# Patient Record
Sex: Male | Born: 1995 | Race: Black or African American | Hispanic: No | Marital: Single | Smoking: Former smoker
Health system: Southern US, Community
[De-identification: ages and names within clinical notes are randomized; demographics above are authoritative.]

## PROBLEM LIST (undated history)

## (undated) DIAGNOSIS — J45909 Unspecified asthma, uncomplicated: Secondary | ICD-10-CM

---

## 2016-11-02 ENCOUNTER — Emergency Department (HOSPITAL_COMMUNITY): Payer: TRICARE For Life (TFL)

## 2016-11-02 ENCOUNTER — Encounter (HOSPITAL_COMMUNITY): Payer: Self-pay

## 2016-11-02 ENCOUNTER — Emergency Department (HOSPITAL_COMMUNITY)
Admission: EM | Admit: 2016-11-02 | Discharge: 2016-11-03 | Disposition: A | Discharge status: Home / Self Care | Payer: TRICARE For Life (TFL) | Attending: Emergency Medicine | Admitting: Emergency Medicine

## 2016-11-02 DIAGNOSIS — J029 Acute pharyngitis, unspecified: Secondary | ICD-10-CM | POA: Diagnosis not present

## 2016-11-02 DIAGNOSIS — Z87891 Personal history of nicotine dependence: Secondary | ICD-10-CM | POA: Diagnosis not present

## 2016-11-02 DIAGNOSIS — J45909 Unspecified asthma, uncomplicated: Secondary | ICD-10-CM | POA: Diagnosis not present

## 2016-11-02 DIAGNOSIS — R6889 Other general symptoms and signs: Secondary | ICD-10-CM

## 2016-11-02 HISTORY — DX: Unspecified asthma, uncomplicated: J45.909

## 2016-11-02 LAB — RAPID STREP SCREEN (MED CTR MEBANE ONLY): Streptococcus, Group A Screen (Direct): NEGATIVE

## 2016-11-02 MED ORDER — OSELTAMIVIR PHOSPHATE 75 MG PO CAPS: 75.0000 mg | ORAL_CAPSULE | Freq: Two times a day (BID) | ORAL | 0 refills | Status: AC

## 2016-11-02 MED ORDER — ALBUTEROL SULFATE HFA 108 (90 BASE) MCG/ACT IN AERS
2.0000 | INHALATION_SPRAY | RESPIRATORY_TRACT | Status: DC | PRN
  Administered 2016-11-02: 2 via RESPIRATORY_TRACT
  Filled 2016-11-02: qty 6.7

## 2016-11-02 MED ORDER — BENZONATATE 200 MG PO CAPS: 200.0000 mg | ORAL_CAPSULE | Freq: Three times a day (TID) | ORAL | 0 refills | Status: AC | PRN

## 2016-11-02 NOTE — ED Triage Notes (Signed)
Onset yesterday am upon awakening sore throat, productive cough-yellow mucus, nasal congestion, runny nose, and lightheaded.  Pt has visible sweating on face.  Has been getting chills and sweats.

## 2016-11-02 NOTE — ED Notes (Signed)
See EDP assessment 

## 2016-11-02 NOTE — ED Provider Notes (Signed)
[facility]-EMERGENCY DEPT Provider Note   CSN: [id number] Arrival date & time: 11/02/16  2126  By signing my name below, I, Valentino SaxonBianca Contreras, attest that this documentation has been prepared under the direction and in the presence of Kerrie BuffaloHope Neese, NP Electronically Signed: Valentino SaxonBianca Contreras, ED Scribe. 11/02/16. 10:48 PM.  History   Chief Complaint Chief Complaint  Patient presents with  . Cough  . Sore Throat   The history is provided by the patient. No language interpreter was used.   HPI Comments: Bruce Hernandez is a 20 y.o. male who presents to the Emergency Department complaining of moderate, productive cough followed by a sore throat onset yesterday morning. Pt reports associated sweats, chills, fever, nasal congestion, rhinorrhea, and light-headedness. He notes coughing up a brown colored mucous with his cough. Pt denies having a flu shot administered this year. Pt also notes having a nosebleed here at the ED. Per pt, he notes having asthma in the third grade. No modifying factors noted. Pt denies vomiting, diarrhea and nausea. Pt denies additional complaints at this time.   Past Medical History:  Diagnosis Date  . Asthma     There are no active problems to display for this patient.   History reviewed. No pertinent surgical history.     Home Medications    Prior to Admission medications   Medication Sig Start Date End Date Taking? Authorizing Provider  benzonatate (TESSALON) 200 MG capsule Take 1 capsule (200 mg total) by mouth 3 (three) times daily as needed for cough. 11/02/16   Hope Orlene OchM Neese, NP  oseltamivir (TAMIFLU) 75 MG capsule Take 1 capsule (75 mg total) by mouth every 12 (twelve) hours. 11/02/16   Hope Orlene OchM Neese, NP    Family History History reviewed. No pertinent family history.  Social History Social History  Substance Use Topics  . Smoking status: Former Games developermoker  . Smokeless tobacco: Never Used  . Alcohol use Yes     Comment: occ      Allergies   Patient  has no known allergies.   Review of Systems Review of Systems  Constitutional: Positive for chills and fever.  HENT: Positive for congestion, nosebleeds, rhinorrhea and sore throat.   Respiratory: Positive for cough.   Gastrointestinal: Negative for diarrhea and vomiting.  Neurological: Positive for light-headedness.  All other systems reviewed and are negative.    Physical Exam Updated Vital Signs BP 139/62 (BP Location: Right Arm)   Pulse 88   Temp 99.1 F (37.3 C) (Oral)   Resp (!) 28   Ht 5\' 10"  (1.778 m)   Wt 106 kg   SpO2 98%   BMI 33.53 kg/m   Physical Exam  Constitutional: He is oriented to person, place, and time. He appears well-developed and well-nourished. No distress.  Patient appears to feel bad.  HENT:  Head: Normocephalic and atraumatic.  Right Ear: Tympanic membrane normal.  Left Ear: Tympanic membrane normal.  Nose: Mucosal edema present. Epistaxis (that stopped with holding pressure) is observed. Right sinus exhibits no maxillary sinus tenderness and no frontal sinus tenderness. Left sinus exhibits no maxillary sinus tenderness and no frontal sinus tenderness.  Mouth/Throat: Uvula is midline and mucous membranes are normal. Posterior oropharyngeal erythema present. No oropharyngeal exudate or posterior oropharyngeal edema.  Eyes: EOM are normal. Pupils are equal, round, and reactive to light.  Neck: Normal range of motion. Neck supple.  Cardiovascular: Normal rate and regular rhythm.   Pulmonary/Chest: Effort normal. He has wheezes.  Expiratory wheezes on the right.  Abdominal: Soft. Bowel sounds are normal. There is no tenderness.  Musculoskeletal: Normal range of motion. He exhibits no edema or tenderness.  Lymphadenopathy:    He has no cervical adenopathy.  Neurological: He is alert and oriented to person, place, and time. Coordination normal.  Skin: Skin is warm. No rash noted. He is diaphoretic. No erythema.  Psychiatric: He has a normal mood  and affect. His behavior is normal.  Nursing note and vitals reviewed.    ED Treatments / Results   DIAGNOSTIC STUDIES: Oxygen Saturation is 97% on RA, normal by my interpretation.    COORDINATION OF CARE: 10:44 PM Discussed treatment plan with pt at bedside which includes rapid strep screen, albuterol, and pt agreed to plan.   Labs (all labs ordered are listed, but only abnormal results are displayed) Labs Reviewed  [facility]ID ST[facility]EN ([facility])  CULTURE, GROUP A STREP [facility])     Radiology Dg Chest 2 View  Result Date: 11/02/2016 CLINICAL DATA:  Initial evaluation for acute productive cough, fever. EXAM: CHEST  2 VIEW COMPARISON:  None. FINDINGS: The heart size and mediastinal contours are within normal limits. Both lungs are clear. The visualized skeletal structures are unremarkable. IMPRESSION: No active cardiopulmonary disease. Electronically Signed   By: Rise MuBenjamin  McClintock M.D.   On: 11/02/2016 23:37    Procedures Procedures (including critical care time)  Medications Ordered in ED Medications  albuterol (PROVENTIL HFA;VENTOLIN HFA) 108 (90 Base) MCG/ACT inhaler 2 puff (2 puffs Inhalation Given 11/02/16 2330)  oseltamivir (TAMIFLU) capsule 75 mg (not administered)     Initial Impression / Assessment and Plan / ED Course  I have reviewed the triage vital signs and the nursing notes.  Pertinent labs & imaging results that were available during my care of the patient were reviewed by me and considered in my medical decision making (see chart for details).  Clinical Course   SUBJECTIVE:  Bruce Hernandez is a 20 y.o. male who present complaining of flu-like symptoms: fevers, chills, myalgias, congestion, sore throat and cough for 1 day. Denies dyspnea.  OBJECTIVE: Appears moderately ill but not toxic; temperature as noted in vitals. Ears normal. Throat and pharynx normal.  Neck supple. No adenopathy in the neck. Sinuses non tender. The chest with occasional  expiratory wheeze.  ASSESSMENT: Influenza  PLAN: Symptomatic therapy suggested: rest, increase fluids, gargle prn for sore throat, apply heat to sinuses prn, use mist of vaporizer prn and call prn if symptoms persist or worsen. Call or return to ED prn if these symptoms worsen or fail to improve as anticipated. Rx Tamiflu since symptoms started in the past 24 hours, Tessalon for cough.  Albuterol Inhaler given prior to d/c.   Final Clinical Impressions(s) / ED Diagnoses   Final diagnoses:  Influenza-like symptoms    New Prescriptions New Prescriptions   BENZONATATE (TESSALON) 200 MG CAPSULE    Take 1 capsule (200 mg total) by mouth 3 (three) times daily as needed for cough.   OSELTAMIVIR (TAMIFLU) 75 MG CAPSULE    Take 1 capsule (75 mg total) by mouth every 12 (twelve) hours.   I personally performed the services described in this documentation, which was scribed in my presence. The recorded information has been reviewed and is accurate.     [address]Hope LockwoodM Neese, [place]NP 11/03/16 [id number]    Margarita Grizzleanielle Ray, MD 11/03/16 [phone]

## 2016-11-02 NOTE — ED Notes (Signed)
Pt back from X-ray.  

## 2016-11-03 MED ORDER — OSELTAMIVIR PHOSPHATE 75 MG PO CAPS
75.0000 mg | ORAL_CAPSULE | ORAL | Status: AC
  Administered 2016-11-03: 75 mg via ORAL
  Filled 2016-11-03: qty 1

## 2016-11-05 LAB — CULTURE, GROUP A STREP (THRC)

## 2017-11-26 IMAGING — DX DG CHEST 2V
2 series · 2 of 2 positions shown · non-contrast
Comparison: None.

CLINICAL DATA: Initial evaluation for acute productive cough,
fever.

EXAM:
CHEST  2 VIEW

[chest pa]
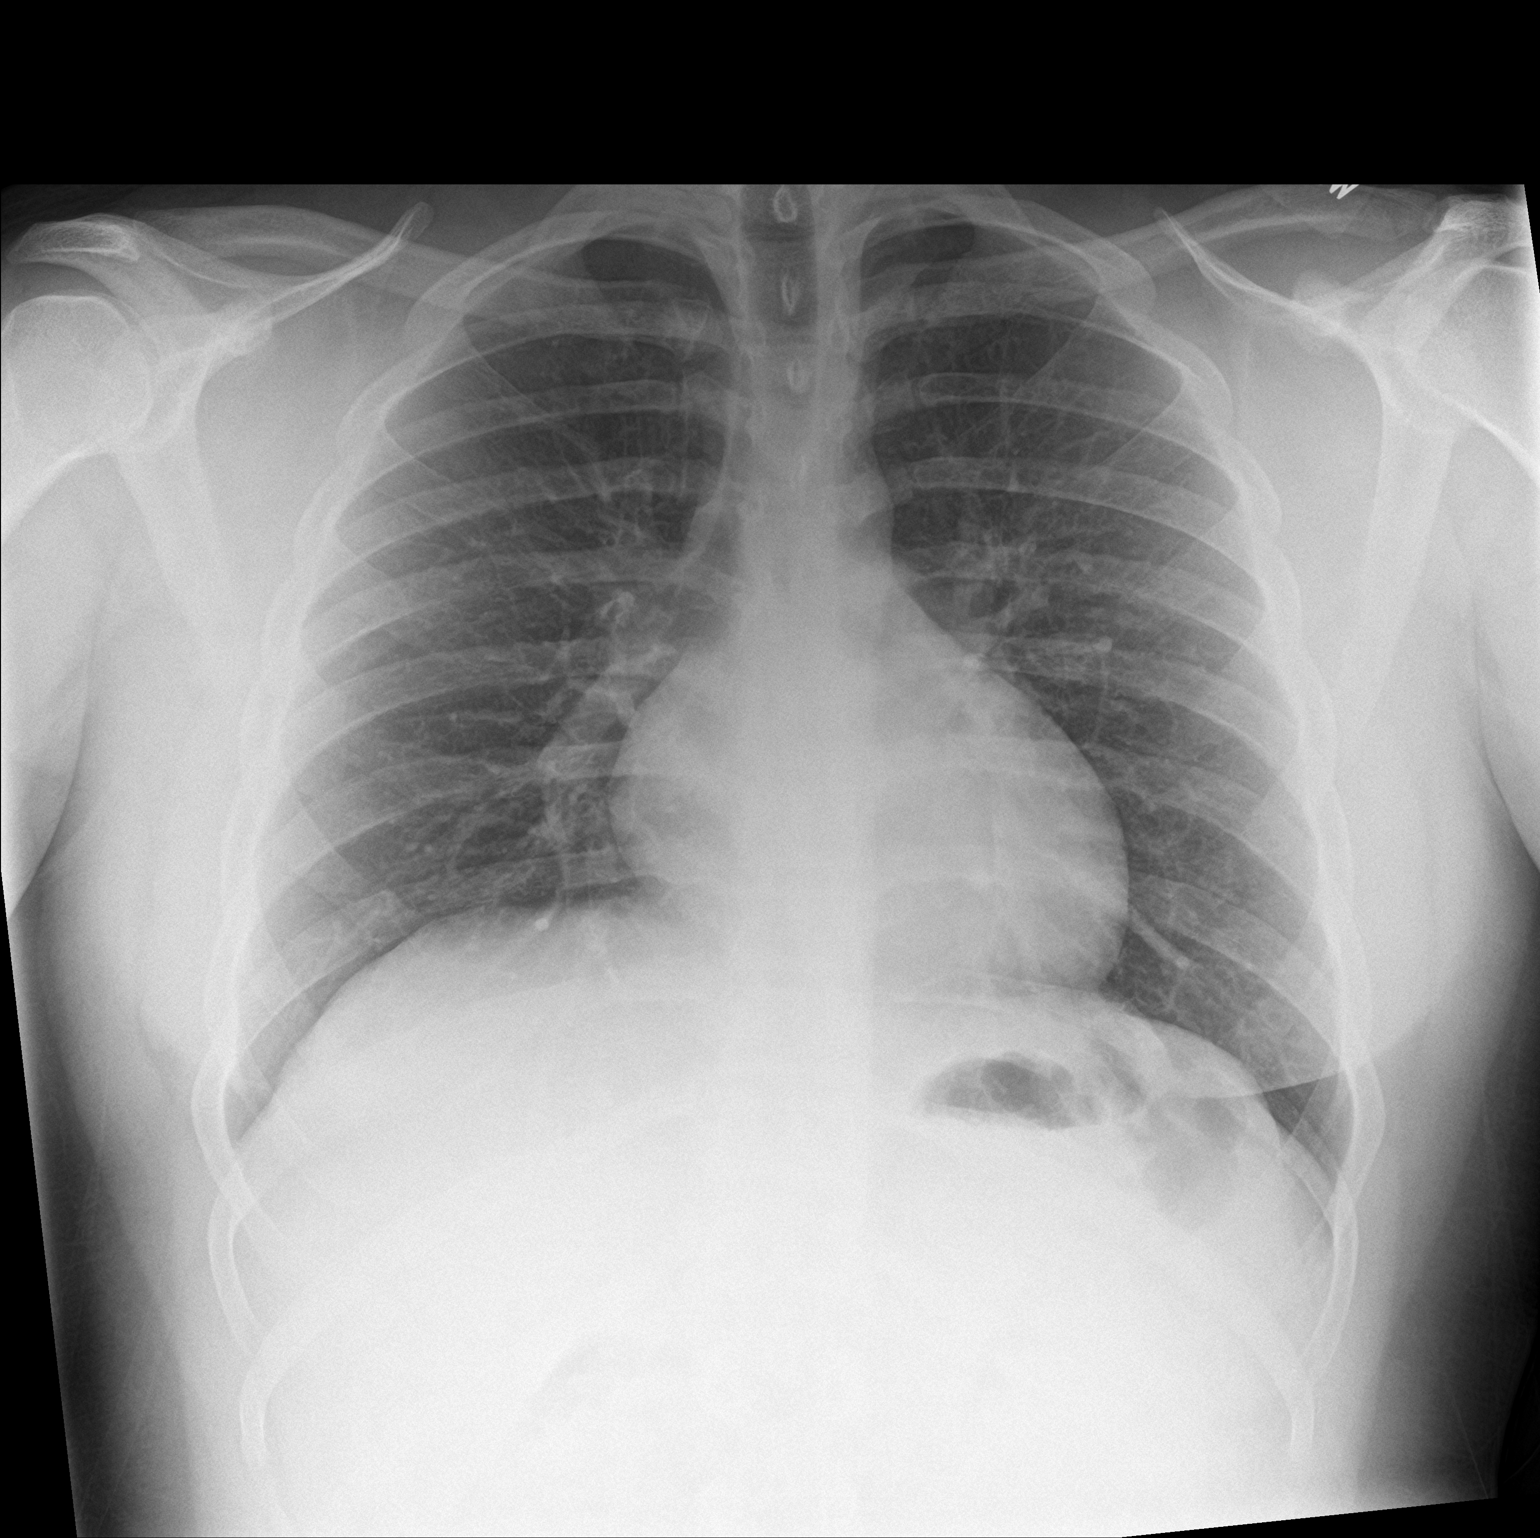

[chest lat]
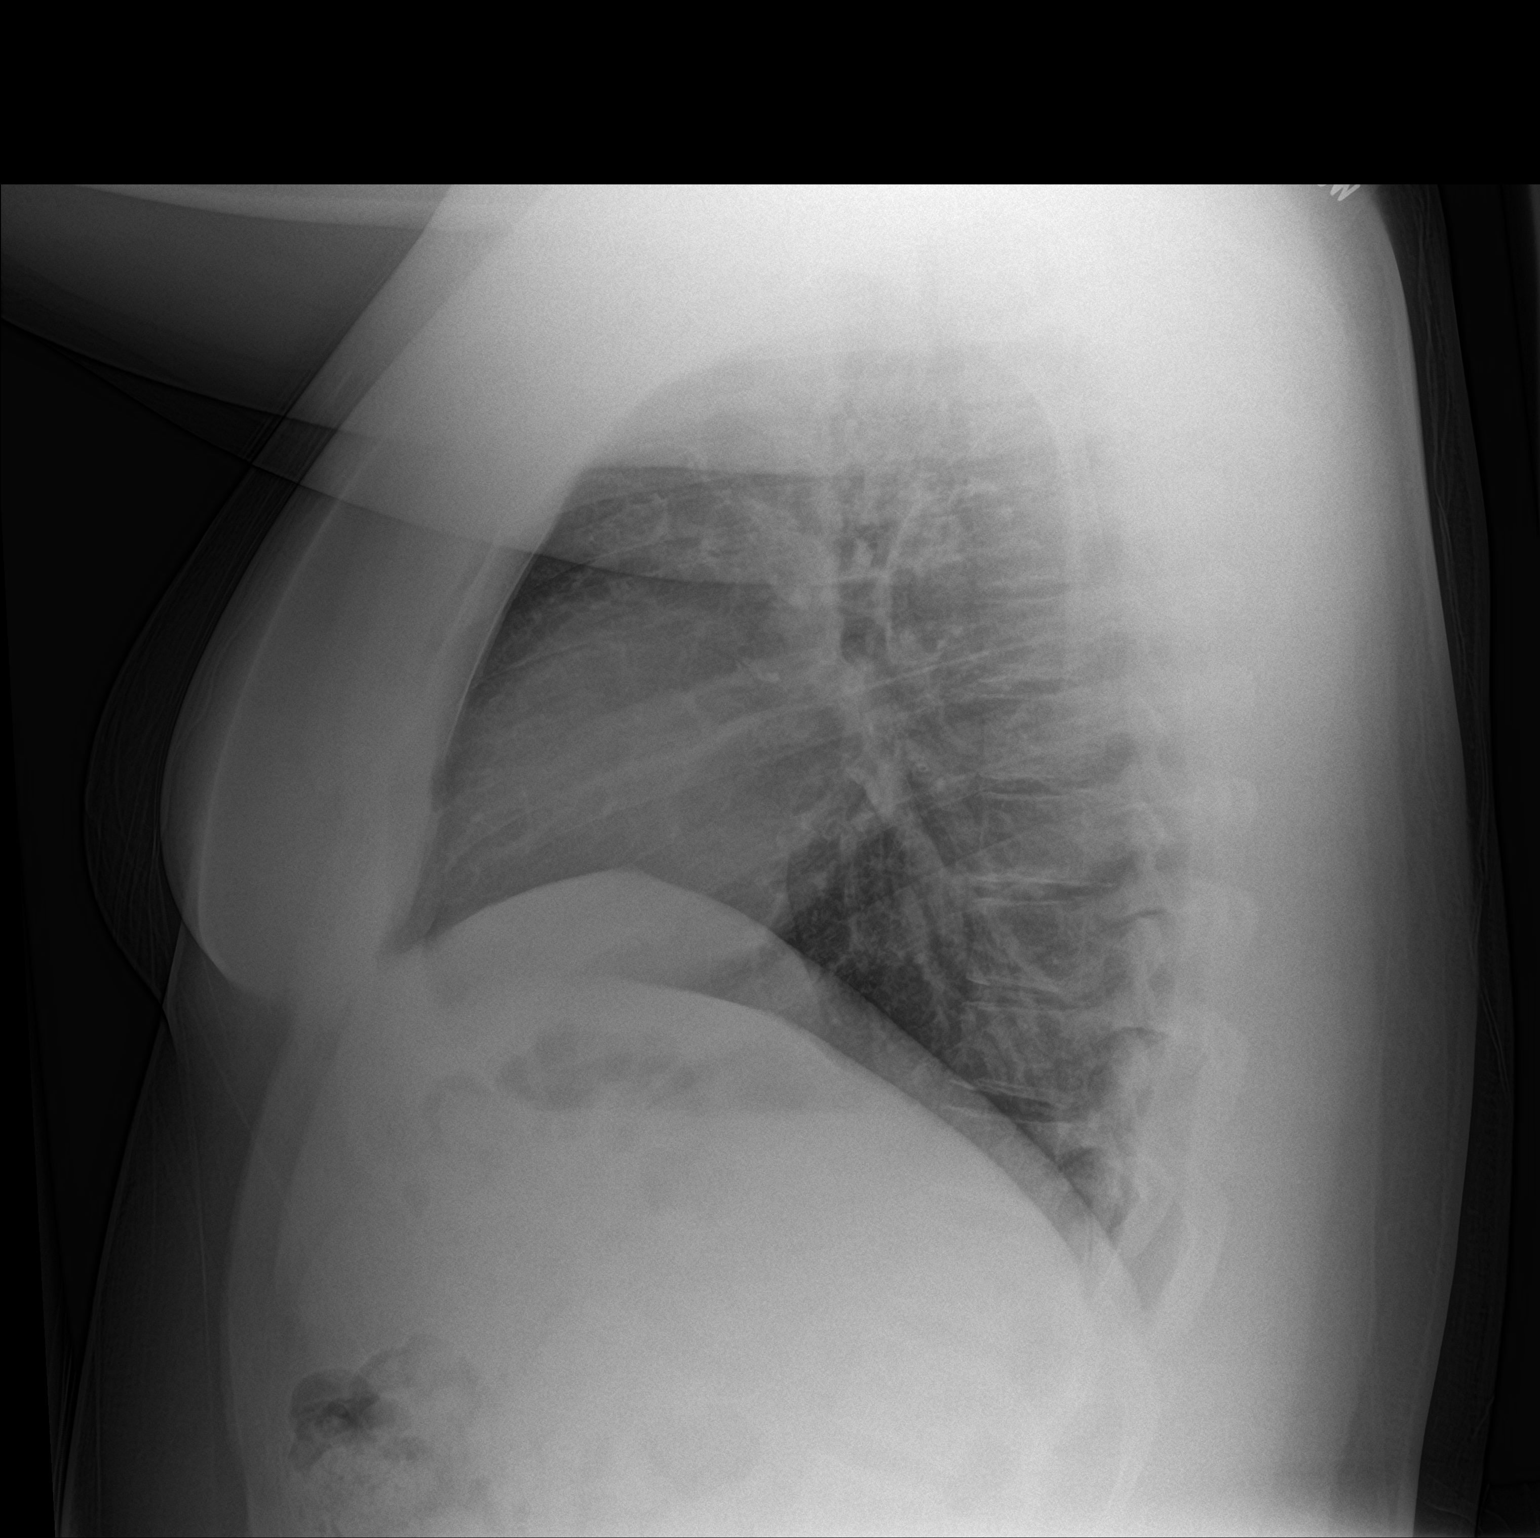

[2 of 2 positions shown; findings below may reference images not displayed]

FINDINGS: The heart size and mediastinal contours are within normal limits.
Both lungs are clear. The visualized skeletal structures are
unremarkable.
IMPRESSION: No active cardiopulmonary disease.

## 2019-07-16 ENCOUNTER — Other Ambulatory Visit: Payer: Self-pay

## 2019-07-16 DIAGNOSIS — Z20822 Contact with and (suspected) exposure to covid-19: Secondary | ICD-10-CM

## 2019-07-17 LAB — NOVEL CORONAVIRUS, NAA: SARS-CoV-2, NAA: NOT DETECTED
